# Patient Record
Sex: Male | Born: 1950 | Race: White | Hispanic: No | Marital: Married | Smoking: Never smoker
Health system: Southern US, Community
[De-identification: ages and names within clinical notes are randomized; demographics above are authoritative.]

## PROBLEM LIST (undated history)

## (undated) DIAGNOSIS — I10 Essential (primary) hypertension: Secondary | ICD-10-CM

## (undated) DIAGNOSIS — D649 Anemia, unspecified: Secondary | ICD-10-CM

## (undated) DIAGNOSIS — I4891 Unspecified atrial fibrillation: Secondary | ICD-10-CM

## (undated) HISTORY — DX: Anemia, unspecified: D64.9

## (undated) HISTORY — DX: Essential (primary) hypertension: I10

## (undated) HISTORY — PX: FINGER SURGERY: SHX640

## (undated) HISTORY — DX: Unspecified atrial fibrillation: I48.91

## (undated) HISTORY — PX: OTHER SURGICAL HISTORY: SHX169

## (undated) HISTORY — PX: RHINOPLASTY: SUR1284

## (undated) HISTORY — PX: TONSILLECTOMY: SUR1361

---

## 2004-09-03 ENCOUNTER — Other Ambulatory Visit: Payer: Self-pay

## 2004-09-04 ENCOUNTER — Ambulatory Visit: Payer: Self-pay | Admitting: Otolaryngology

## 2005-09-04 ENCOUNTER — Ambulatory Visit: Payer: Self-pay

## 2006-11-06 ENCOUNTER — Ambulatory Visit: Payer: Self-pay | Admitting: Chiropractic Medicine

## 2011-10-23 ENCOUNTER — Ambulatory Visit: Payer: Self-pay | Admitting: Physician Assistant

## 2011-12-08 ENCOUNTER — Ambulatory Visit: Payer: Self-pay | Admitting: Orthopedic Surgery

## 2011-12-08 LAB — CREATININE, SERUM
EGFR (African American): >60
EGFR (Non-African Amer.): >60

## 2011-12-09 ENCOUNTER — Ambulatory Visit: Payer: Self-pay | Admitting: Orthopedic Surgery

## 2012-09-08 DIAGNOSIS — I4891 Unspecified atrial fibrillation: Secondary | ICD-10-CM

## 2012-09-08 HISTORY — DX: Unspecified atrial fibrillation: I48.91

## 2012-09-08 HISTORY — PX: CARDIAC ELECTROPHYSIOLOGY MAPPING AND ABLATION: SHX1292

## 2012-11-08 ENCOUNTER — Ambulatory Visit: Payer: Self-pay | Admitting: Unknown Physician Specialty

## 2012-11-09 LAB — PATHOLOGY REPORT

## 2013-06-15 ENCOUNTER — Ambulatory Visit: Payer: Self-pay | Admitting: Internal Medicine

## 2013-06-17 ENCOUNTER — Encounter: Payer: Self-pay | Admitting: *Deleted

## 2013-06-21 ENCOUNTER — Ambulatory Visit (INDEPENDENT_AMBULATORY_CARE_PROVIDER_SITE_OTHER): Payer: BC Managed Care – PPO | Admitting: Podiatry

## 2013-06-21 ENCOUNTER — Encounter: Payer: Self-pay | Admitting: Podiatry

## 2013-06-21 VITALS — BP 116/82 | HR 102 | Resp 16 | Ht 74.0 in | Wt 290.0 lb

## 2013-06-21 DIAGNOSIS — M779 Enthesopathy, unspecified: Secondary | ICD-10-CM

## 2013-06-21 MED ORDER — HYDROCODONE-ACETAMINOPHEN 10-325 MG PO TABS: 1.0000 | ORAL_TABLET | Freq: Three times a day (TID) | ORAL | Status: DC | PRN

## 2013-06-21 MED ORDER — TRIAMCINOLONE ACETONIDE 10 MG/ML IJ SUSP
5.0000 mg | Freq: Once | INTRAMUSCULAR | Status: AC
  Administered 2013-06-21: 5 mg via INTRA_ARTICULAR

## 2013-06-21 NOTE — Progress Notes (Signed)
Subjective:     Patient ID: Keith Marsh, male   DOB: 1951-02-04, 62 y.o.   MRN: [id number]  HPI patient states I'm still getting burning in my feet and at times I have trouble sleeping. the right ankle is still tender at the left one feel some better   Review of Systems  All other systems reviewed and are negative.       Objective:   Physical Exam  Nursing note and vitals reviewed. Constitutional: He appears well-developed.  Cardiovascular: Intact distal pulses.   Neurological: He is alert.   patient has quite a bit of discomfort in the right sinus tarsi and is feet in general     Assessment:     Sinus tarsitis right. 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

## 2013-06-26 ENCOUNTER — Ambulatory Visit: Payer: Self-pay | Admitting: Family Medicine

## 2013-06-26 ENCOUNTER — Emergency Department: Payer: Self-pay | Admitting: Emergency Medicine

## 2013-06-26 LAB — APTT: Activated PTT: 63.8 secs — ABNORMAL HIGH (ref 23.6–35.9)

## 2013-06-26 LAB — CBC WITH DIFFERENTIAL/PLATELET
Basophil #: 0.1 10*3/uL (ref 0.0–0.1)
Eosinophil %: 3.3 %
Lymphocyte #: 2.4 10*3/uL (ref 1.0–3.6)
Lymphocyte %: 24.7 %
MCHC: 33.6 g/dL (ref 32.0–36.0)
MCV: 86 fL (ref 80–100)
Monocyte #: 0.9 x10 3/mm (ref 0.2–1.0)
Neutrophil #: 5.8 10*3/uL (ref 1.4–6.5)
Neutrophil %: 61 %
RBC: 4.42 10*6/uL (ref 4.40–5.90)
RDW: 15 % — ABNORMAL HIGH (ref 11.5–14.5)
WBC: 9.5 10*3/uL (ref 3.8–10.6)

## 2013-06-26 LAB — PROTIME-INR: INR: 1.9

## 2013-06-26 LAB — COMPREHENSIVE METABOLIC PANEL
Albumin: 3.6 g/dL (ref 3.4–5.0)
Chloride: 107 mmol/L (ref 98–107)
Creatinine: 0.83 mg/dL (ref 0.60–1.30)
EGFR (Non-African Amer.): >60
Potassium: 4.3 mmol/L (ref 3.5–5.1)
SGPT (ALT): 21 U/L (ref 12–78)
Sodium: 139 mmol/L (ref 136–145)
Total Protein: 7.1 g/dL (ref 6.4–8.2)

## 2013-06-26 LAB — TROPONIN I: Troponin-I: <0.02 ng/mL

## 2013-10-24 ENCOUNTER — Ambulatory Visit: Payer: Self-pay | Admitting: Physical Medicine and Rehabilitation

## 2014-06-02 ENCOUNTER — Ambulatory Visit: Payer: Self-pay | Admitting: Cardiology

## 2016-01-16 ENCOUNTER — Encounter: Payer: Self-pay | Admitting: *Deleted

## 2016-01-22 ENCOUNTER — Encounter: Payer: Self-pay | Admitting: General Surgery

## 2016-01-22 ENCOUNTER — Ambulatory Visit (INDEPENDENT_AMBULATORY_CARE_PROVIDER_SITE_OTHER): Payer: BLUE CROSS/BLUE SHIELD | Admitting: General Surgery

## 2016-01-22 VITALS — BP 138/82 | HR 62 | Resp 14 | Ht 73.0 in | Wt 327.0 lb

## 2016-01-22 DIAGNOSIS — L723 Sebaceous cyst: Secondary | ICD-10-CM | POA: Diagnosis not present

## 2016-01-22 NOTE — Patient Instructions (Signed)
The patient is aware to call back for any questions or concerns. Plan for office excision

## 2016-01-22 NOTE — Progress Notes (Signed)
Patient ID: Keith Marsh, male   DOB: 08-Oct-1950, 65 y.o.   MRN: [id number]  Chief Complaint  Patient presents with  . Cyst    HPI Keith Marsh is a 65 y.o. male.  Here today for evaluation of an infected cyst on his upper back. He has completed the Levaquin antibiotics today. He states that Friday it opened up and started draining. He has had this area for about 2 years and each time it flares up it gets a little large. He states it flares up about every 2-3 months. Currently not taking the gabapentin.  HPI  Past Medical History  Diagnosis Date  . Hypertension   . Anemia   . Atrial fibrillation (HCC) 2014    Past Surgical History  Procedure Laterality Date  . Rhinoplasty    . Finger surgery Left     THUMB AND INDEX  . Tonsillectomy    . Ear cartridge Left   . Cardiac electrophysiology mapping and ablation  2014    [facility]    Family History  Problem Relation Age of Onset  . Dementia Mother     Social History Social History  Substance Use Topics  . Smoking status: Never Smoker   . Smokeless tobacco: Never Used  . Alcohol Use: Yes     Comment: SOCIALLY    Allergies  Allergen Reactions  . Amoxicillin-Pot Clavulanate Nausea Only    Current Outpatient Prescriptions  Medication Sig Dispense Refill  . acetaminophen (TYLENOL) 500 MG tablet Take 500 mg by mouth every 4 (four) hours as needed for pain.    . baclofen (LIORESAL) 10 MG tablet Take 10 mg by mouth 3 (three) times daily.   0  . ELIQUIS 5 MG TABS tablet Take 5 mg by mouth 2 (two) times daily.     Marland Kitchen HYDROcodone-acetaminophen (NORCO/VICODIN) 5-325 MG tablet take 1 tablet by mouth three times a day if needed  0  . metoprolol succinate (TOPROL-XL) 25 MG 24 hr tablet Take 25 mg by mouth daily.     . metoprolol succinate (TOPROL-XL) 50 MG 24 hr tablet Take 50 mg by mouth daily. Take with or immediately following a meal.    . torsemide (DEMADEX) 20 MG tablet Take 20 mg by mouth. 2 in the am 1 in the pm    . verapamil  (CALAN) 40 MG tablet Take 40 mg by mouth as needed.     . verapamil (CALAN-SR) 240 MG CR tablet Take 240 mg by mouth daily.     Marland Kitchen gabapentin (NEURONTIN) 300 MG capsule Take 300 mg by mouth 2 (two) times daily. Reported on 01/22/2016     No current facility-administered medications for this visit.    Review of Systems Review of Systems  Constitutional: Negative.   Respiratory: Negative.   Cardiovascular: Negative.     Blood pressure 138/82, pulse 62, resp. rate 14, height  (1.854 m), weight 327 lb (148.326 kg).  Physical Exam Physical Exam  Constitutional: He is oriented to person, place, and time. He appears well-developed and well-nourished.  HENT:  Mouth/Throat: Oropharynx is clear and moist.  Eyes: Conjunctivae are normal. No scleral icterus.  Neck: Neck supple.  Cardiovascular: Normal rate, regular rhythm and normal heart sounds.   Pulmonary/Chest: Effort normal and breath sounds normal.  Lymphadenopathy:    He has no cervical adenopathy.  Neurological: He is alert and oriented to person, place, and time.  Skin: Skin is warm and dry.  2.5 x 3 area, little fluctuants,  central upper back  Psychiatric: His behavior is normal.      Assessment    Sebaceous cyst of the back.    Plan    The area has shown a significant decrease in size by patient reports in spontaneous drainage. We'll have him return in about 2-3 weeks for excision to allow resection of minimal normal tissue. While the patient reports he is not excited about having a local procedure, considering his size and cardiac history he would be a poor candidate for prone procedure under general anesthesia.    Recommend avoiding caffeine. Plan for office excision     PCP/Ref:  Keith Marsh This information has been scribed by Keith Marsh.    Keith Marsh 01/23/2016, 8:44 PM

## 2016-01-23 DIAGNOSIS — L723 Sebaceous cyst: Secondary | ICD-10-CM | POA: Insufficient documentation

## 2016-02-07 ENCOUNTER — Encounter: Payer: Self-pay | Admitting: *Deleted

## 2016-02-11 ENCOUNTER — Ambulatory Visit (INDEPENDENT_AMBULATORY_CARE_PROVIDER_SITE_OTHER): Payer: BLUE CROSS/BLUE SHIELD | Admitting: General Surgery

## 2016-02-11 VITALS — BP 140/78 | HR 72 | Resp 14 | Ht 73.0 in | Wt 323.0 lb

## 2016-02-11 DIAGNOSIS — R229 Localized swelling, mass and lump, unspecified: Secondary | ICD-10-CM

## 2016-02-11 DIAGNOSIS — R222 Localized swelling, mass and lump, trunk: Secondary | ICD-10-CM

## 2016-02-11 NOTE — Patient Instructions (Signed)
Return in one week nurse 

## 2016-02-11 NOTE — Progress Notes (Signed)
Patient ID: Keith Marsh, male   DOB: [birth year], 65 y.o.   MRN: [id number]  Chief Complaint  Patient presents with  . Procedure    excision cyst    HPI Keith Marsh is a 65 y.o. male here today for excision back cyst. The patient has had marked resolution in the discomfort at the site of the inflamed sebaceous cyst since his visit last month. He returns today for excision. HPI  Past Medical History  Diagnosis Date  . Hypertension   . Anemia   . Atrial fibrillation (HCC) 2014    Past Surgical History  Procedure Laterality Date  . Rhinoplasty    . Finger surgery Left     THUMB AND INDEX  . Tonsillectomy    . Ear cartridge Left   . Cardiac electrophysiology mapping and ablation  2014    [facility]    Family History  Problem Relation Age of Onset  . Dementia Mother     Social History Social History  Substance Use Topics  . Smoking status: Never Smoker   . Smokeless tobacco: Never Used  . Alcohol Use: Yes     Comment: SOCIALLY    Allergies  Allergen Reactions  . Amoxicillin-Pot Clavulanate Nausea Only    Current Outpatient Prescriptions  Medication Sig Dispense Refill  . acetaminophen (TYLENOL) 500 MG tablet Take 500 mg by mouth every 4 (four) hours as needed for pain.    . baclofen (LIORESAL) 10 MG tablet Take 10 mg by mouth 3 (three) times daily.   0  . ELIQUIS 5 MG TABS tablet Take 5 mg by mouth 2 (two) times daily.     Marland Kitchen. gabapentin (NEURONTIN) 300 MG capsule Take 300 mg by mouth 2 (two) times daily. Reported on 01/22/2016    . HYDROcodone-acetaminophen (NORCO/VICODIN) 5-325 MG tablet take 1 tablet by mouth three times a day if needed  0  . metoprolol succinate (TOPROL-XL) 50 MG 24 hr tablet Take 50 mg by mouth daily. Take with or immediately following a meal.    . torsemide (DEMADEX) 20 MG tablet Take 20 mg by mouth. 2 in the am 1 in the pm    . verapamil (CALAN) 40 MG tablet Take 40 mg by mouth as needed.     . verapamil (CALAN-SR) 240 MG CR tablet Take 240 mg  by mouth daily.     . metoprolol succinate (TOPROL-XL) 25 MG 24 hr tablet Take 25 mg by mouth daily.      No current facility-administered medications for this visit.    Review of Systems Review of Systems  Constitutional: Negative.   Respiratory: Negative.   Cardiovascular: Negative.     Blood pressure 140/78, pulse 72, resp. rate 14, height 6\' 1"  (1.854 m), weight 323 lb (146.512 kg).  Physical Exam Physical Exam  Constitutional: He is oriented to person, place, and time. He appears well-developed and well-nourished.  Eyes: Conjunctivae are normal. No scleral icterus.  Neck: Neck supple.  Cardiovascular: Normal rate, regular rhythm and normal heart sounds.   Pulmonary/Chest: Breath sounds normal.    Lymphadenopathy:    He has no cervical adenopathy.  Neurological: He is alert and oriented to person, place, and time.  Skin: Skin is warm and dry.      Assessment    Sebaceous cyst of the back.    Plan    The area was cleansed with alcohol and a total of 20 mL of 0.5% Xylocaine with 0.25% Marcaine with 1-200,000 of epinephrine was  utilized well tolerated. ChloraPrep was applied to the skin. The area was excised through elliptical incision including the central sinus. Hemostasis was with 3-0 Vicryl sutures. The deep tissue was approximated with interrupted 3-0 Vicryl simple sutures. The skin was closed with interrupted 4-0 Prolene simple sutures. A dry dressing with Telfa and Tegaderm was applied. Ice pack provided.  The patient will make use of Tylenol/Advil/Aleve if needed for pain.    Patient to return in one week nurse for suture removal PCP:  Dareen Piano This information has been scribed by Ples Specter CMA.    Keith Marsh 02/11/2016, 10:36 PM

## 2016-02-18 ENCOUNTER — Ambulatory Visit (INDEPENDENT_AMBULATORY_CARE_PROVIDER_SITE_OTHER): Payer: BLUE CROSS/BLUE SHIELD

## 2016-02-18 DIAGNOSIS — R222 Localized swelling, mass and lump, trunk: Secondary | ICD-10-CM

## 2016-02-18 DIAGNOSIS — R229 Localized swelling, mass and lump, unspecified: Secondary | ICD-10-CM

## 2016-02-18 NOTE — Progress Notes (Signed)
Patient came in today for a wound check.  The wound is clean, with no signs of infection noted. Follow up as needed. The sutures were removed and steri strips applied. The patient is aware of his pathology results.

## 2017-05-05 ENCOUNTER — Other Ambulatory Visit: Payer: Self-pay | Admitting: Physical Medicine and Rehabilitation

## 2017-05-05 DIAGNOSIS — M5416 Radiculopathy, lumbar region: Secondary | ICD-10-CM

## 2017-05-14 ENCOUNTER — Ambulatory Visit
Admission: RE | Admit: 2017-05-14 | Discharge: 2017-05-14 | Disposition: A | Discharge status: Home / Self Care | Payer: BLUE CROSS/BLUE SHIELD | Source: Ambulatory Visit | Attending: Physical Medicine and Rehabilitation | Admitting: Physical Medicine and Rehabilitation

## 2017-05-14 DIAGNOSIS — M5416 Radiculopathy, lumbar region: Secondary | ICD-10-CM | POA: Diagnosis present

## 2017-05-14 DIAGNOSIS — M48061 Spinal stenosis, lumbar region without neurogenic claudication: Secondary | ICD-10-CM | POA: Diagnosis not present

## 2017-05-14 DIAGNOSIS — M5125 Other intervertebral disc displacement, thoracolumbar region: Secondary | ICD-10-CM | POA: Diagnosis not present

## 2017-05-14 DIAGNOSIS — M5126 Other intervertebral disc displacement, lumbar region: Secondary | ICD-10-CM | POA: Insufficient documentation

## 2018-02-19 ENCOUNTER — Ambulatory Visit: Payer: Self-pay

## 2018-03-05 ENCOUNTER — Ambulatory Visit (INDEPENDENT_AMBULATORY_CARE_PROVIDER_SITE_OTHER): Payer: Medicare Other | Admitting: Urology

## 2018-03-05 ENCOUNTER — Encounter: Payer: Self-pay | Admitting: Urology

## 2018-03-05 VITALS — BP 133/81 | HR 78 | Ht 73.0 in | Wt 331.0 lb

## 2018-03-05 DIAGNOSIS — R350 Frequency of micturition: Secondary | ICD-10-CM | POA: Diagnosis not present

## 2018-03-05 DIAGNOSIS — R3129 Other microscopic hematuria: Secondary | ICD-10-CM

## 2018-03-05 LAB — URINALYSIS, COMPLETE
Bilirubin, UA: NEGATIVE
Glucose, UA: NEGATIVE
Ketones, UA: NEGATIVE
LEUKOCYTES UA: NEGATIVE
Nitrite, UA: NEGATIVE
Protein, UA: NEGATIVE
Specific Gravity, UA: 1.025 (ref 1.005–1.030)
Urobilinogen, Ur: 0.2 mg/dL (ref 0.2–1.0)
pH, UA: 5.5 (ref 5.0–7.5)

## 2018-03-05 LAB — MICROSCOPIC EXAMINATION
BACTERIA UA: NONE SEEN
EPITHELIAL CELLS (NON RENAL): NONE SEEN /HPF (ref 0–10)
WBC, UA: NONE SEEN /hpf (ref 0–5)

## 2018-03-05 NOTE — Progress Notes (Signed)
03/05/2018 4:15 PM   Keith Marsh 10-10-1950 [id number]  Referring provider: Lauro Regulus, MD [address] [facility] [place], [place] [address]  Chief Complaint  Patient presents with  . Hematuria    HPI:  New pt for hematuria. A UA in 2015 showed 12 rbc's. F/u UA 2019 showed 4-10 rbc's. UA today with 3-10 rbc and otherwise clear. He has no clots but occasional "pink" urine. No h/o stones. He voids with occasional weak stream. He has frequency and nocturia. He started tamsulosin and noted no change. 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 No smoking. No chemo or rt exposure.   His May 2019 PSA was 0.49.   He has a h/o Eliquis use for a fib.   PMH: Past Medical History:  Diagnosis Date  . Anemia   . Atrial fibrillation (HCC) 2014  . Hypertension     Surgical History: Reviewed   Home Medications:  Allergies as of 03/05/2018      Reactions   Amoxicillin-pot Clavulanate Nausea Only      Medication List        Accurate as of 03/05/18  4:15 PM. Always use your most recent med list.          baclofen 10 MG tablet Commonly known as:  LIORESAL Take 10 mg by mouth 3 (three) times daily.   ELIQUIS 5 MG Tabs tablet Generic drug:  apixaban Take 5 mg by mouth 2 (two) times daily.   gabapentin 300 MG capsule Commonly known as:  NEURONTIN Take 300 mg by mouth 2 (two) times daily. Reported on 01/22/2016   HYDROcodone-acetaminophen 5-325 MG tablet Commonly known as:  NORCO/VICODIN take 1 tablet by mouth three times a day if needed   metoprolol succinate 50 MG 24 hr tablet Commonly known as:  TOPROL-XL Take 50 mg by mouth daily. Take with or immediately following a meal.   metoprolol succinate 25 MG 24 hr tablet Commonly known as:  TOPROL-XL Take 25 mg by mouth daily.   torsemide 20 MG tablet Commonly known as:  DEMADEX Take 20 mg by mouth. 2 in the am 1 in the pm   TYLENOL 500 MG tablet Generic  drug:  acetaminophen Take 500 mg by mouth every 4 (four) hours as needed for pain.   verapamil 40 MG tablet Commonly known as:  CALAN Take 40 mg by mouth as needed.   verapamil 240 MG CR tablet Commonly known as:  CALAN-SR Take 240 mg by mouth daily.       Allergies:  Allergies  Allergen Reactions  . Amoxicillin-Pot Clavulanate Nausea Only    Family History: Family History  Problem Relation Age of Onset  . Dementia Mother     Social History:  reports that he has never smoked. He has never used smokeless tobacco. He reports that he drinks alcohol. He reports that he does not use drugs.  ROS: UROLOGY Frequent Urination?: Yes Hard to postpone urination?: No Burning/pain with urination?: No Get up at night to urinate?: Yes Leakage of urine?: Yes Urine stream starts and stops?: Yes Trouble starting stream?: No Do you have to strain to urinate?: No Blood in urine?: Yes Urinary tract infection?: No Sexually transmitted disease?: No Injury to kidneys or bladder?: No Painful intercourse?: No Weak stream?: Yes Erection problems?: No Penile pain?: No  Gastrointestinal Nausea?: No Vomiting?: No Indigestion/heartburn?: Yes Diarrhea?: No Constipation?: No  Constitutional Fever: No Night  sweats?: No Weight loss?: No Fatigue?: No  Skin Skin rash/lesions?: No Itching?: No  Eyes Blurred vision?: No Double vision?: No  Ears/Nose/Throat Sore throat?: No Sinus problems?: No  Hematologic/Lymphatic Swollen glands?: No Easy bruising?: No  Cardiovascular Leg swelling?: No Chest pain?: No  Respiratory Cough?: No Shortness of breath?: No  Endocrine Excessive thirst?: No  Musculoskeletal Back pain?: Yes Joint pain?: Yes  Neurological Headaches?: No Dizziness?: No  Psychologic Depression?: No Anxiety?: No  Physical Exam: BP 133/81   Pulse 78   Ht 6\' 1"  (1.854 m)   Wt (!) 150.1 kg (331 lb)   BMI 43.67 kg/m   Constitutional:  Alert and  oriented, No acute distress. HEENT: [place] AT, moist mucus membranes.  Trachea midline, no masses. Cardiovascular: No clubbing, cyanosis, or edema. Respiratory: Normal respiratory effort, no increased work of breathing. GI: Abdomen is soft, nontender, nondistended, no abdominal masses GU: No CVA tenderness DRE: prostate 20 g, no hard area or nodule  Lymph: No cervical or inguinal lymphadenopathy. Skin: No rashes, bruises or suspicious lesions. Neurologic: Grossly intact, no focal deficits, moving all 4 extremities. Psychiatric: Normal mood and affect.  Laboratory Data: Lab Results  Component Value Date   WBC 9.5 06/26/2013   HGB 12.8 (L) 06/26/2013   HCT 38.2 (L) 06/26/2013   MCV 86 06/26/2013   PLT 410 06/26/2013    Lab Results  Component Value Date   CREATININE 0.83 06/26/2013    No results found for: PSA  No results found for: TESTOSTERONE  No results found for: HGBA1C  Urinalysis No results found for: COLORURINE, APPEARANCEUR, LABSPEC, PHURINE, GLUCOSEU, HGBUR, BILIRUBINUR, KETONESUR, PROTEINUR, UROBILINOGEN, NITRITE, LEUKOCYTESUR  No results found for: LABMICR, WBCUA, RBCUA, LABEPIT, MUCUS, BACTERIA   No results found for this or any previous visit. No results found for this or any previous visit. No results found for this or any previous visit. No results found for this or any previous visit. No results found for this or any previous visit. No results found for this or any previous visit. No results found for this or any previous visit. No results found for this or any previous visit.  Assessment & Plan:    1. Hematuria - needs CT and cystoscopy. Discussed the nature r/b/a to these procedures and he elects to proceed.   2. Frequency - tamsulosin no change. Check cysto as above. Consider OAB med.   - Urinalysis, Complete   No follow-ups on file.  Jerilee FieldMatthew Lorene Klimas, MD  [facility] [address], [address] [place], [place]  [id number] [phone]

## 2018-03-22 ENCOUNTER — Ambulatory Visit
Admission: RE | Admit: 2018-03-22 | Discharge: 2018-03-22 | Disposition: A | Discharge status: Home / Self Care | Payer: Medicare Other | Source: Ambulatory Visit | Attending: Urology | Admitting: Urology

## 2018-03-22 DIAGNOSIS — I2510 Atherosclerotic heart disease of native coronary artery without angina pectoris: Secondary | ICD-10-CM | POA: Insufficient documentation

## 2018-03-22 DIAGNOSIS — R3129 Other microscopic hematuria: Secondary | ICD-10-CM | POA: Insufficient documentation

## 2018-03-22 LAB — POCT I-STAT CREATININE: Creatinine, Ser: 1 mg/dL (ref 0.61–1.24)

## 2018-03-22 MED ORDER — IOPAMIDOL (ISOVUE-300) INJECTION 61%
150.0000 mL | Freq: Once | INTRAVENOUS | Status: AC | PRN
  Administered 2018-03-22: 150 mL via INTRAVENOUS

## 2018-03-23 ENCOUNTER — Telehealth: Payer: Self-pay

## 2018-03-23 NOTE — Telephone Encounter (Signed)
Patient notified

## 2018-03-23 NOTE — Telephone Encounter (Signed)
-----   Message from Jerilee FieldMatthew Eskridge, MD sent at 03/22/2018  4:43 PM EDT ----- Notify patient CT scan was benign. No worrisome findings. F/u for cystoscopy as planned. Radiologist did note he had "advanced atherosclerosis" or hardening of the arteries. He should f/u with his PCP or cardiologist to review.   Thanks.   ----- Message ----- From: Lissa HoardWatts, Mayme Profeta Michelle, CMA Sent: 03/22/2018   4:14 PM To: Jerilee FieldMatthew Eskridge, MD    ----- Message ----- From: Interface, Rad Results In Sent: 03/22/2018   3:55 PM To: [facility]

## 2018-04-22 ENCOUNTER — Ambulatory Visit (INDEPENDENT_AMBULATORY_CARE_PROVIDER_SITE_OTHER): Payer: Medicare Other | Admitting: Urology

## 2018-04-22 ENCOUNTER — Encounter: Payer: Self-pay | Admitting: Urology

## 2018-04-22 VITALS — BP 138/84 | HR 74 | Ht 73.0 in | Wt 333.0 lb

## 2018-04-22 DIAGNOSIS — R3129 Other microscopic hematuria: Secondary | ICD-10-CM

## 2018-04-22 LAB — URINALYSIS, COMPLETE
Bilirubin, UA: NEGATIVE
Glucose, UA: NEGATIVE
Ketones, UA: NEGATIVE
LEUKOCYTES UA: NEGATIVE
Nitrite, UA: NEGATIVE
PH UA: 5 (ref 5.0–7.5)
Protein, UA: NEGATIVE
Urobilinogen, Ur: 0.2 mg/dL (ref 0.2–1.0)

## 2018-04-22 LAB — MICROSCOPIC EXAMINATION
BACTERIA UA: NONE SEEN
EPITHELIAL CELLS (NON RENAL): NONE SEEN /HPF (ref 0–10)
WBC, UA: NONE SEEN /hpf (ref 0–5)

## 2018-04-22 MED ORDER — LIDOCAINE HCL URETHRAL/MUCOSAL 2 % EX GEL
1.0000 | Freq: Once | CUTANEOUS | Status: AC
Start: 2018-04-22 — End: 2018-04-22
  Administered 2018-04-22: 1 via URETHRAL

## 2018-04-22 MED ORDER — CIPROFLOXACIN HCL 500 MG PO TABS
500.0000 mg | ORAL_TABLET | Freq: Once | ORAL | Status: AC
  Administered 2018-04-22: 500 mg via ORAL

## 2018-04-22 NOTE — Progress Notes (Addendum)
Cystoscopy Procedure Note:  Indication: Gross hematuria  After informed consent and discussion of the procedure and its risks, Keith Marsh was positioned and prepped in the standard fashion. Cystoscopy was performed with the flexible cystoscope. The urethra, bladder neck and entire bladder was visualized in a standard fashion. The prostate was short. The ureteral orifices were visualized in their normal location and orientation. Retroflexion did not demonstrate any concerning lesions at the bladder neck. Cytology sent. Mucosa normal throughout.  Findings: 1. Normal cystoscopy, small prostate  Imaging: I personally reviewed his CT urogram from 03/22/2018.  There are no renal masses, filling defects, urolithiasis, or other concerning urologic findings.  This was discussed with the patient.  Assessment and Plan: Follow up as needed Continue flomax for BPH symptoms  Sondra ComeBrian C Sninsky 04/22/2018

## 2018-04-27 ENCOUNTER — Other Ambulatory Visit: Payer: Self-pay | Admitting: Urology

## 2018-04-28 ENCOUNTER — Telehealth: Payer: Self-pay

## 2018-04-28 NOTE — Telephone Encounter (Signed)
-----   Message from Sondra ComeBrian C Sninsky, MD sent at 04/27/2018  5:34 PM EDT ----- Regarding: negative cytology Please let him know his cytology was negative. No worrisome cells from the cystoscopy were seen. He can follow up PRN  Sondra ComeBrian C Sninsky, MD 04/27/2018

## 2018-04-28 NOTE — Telephone Encounter (Signed)
Called pt no answer. LM for pt informing him of the results below. Pt to call back for questions or concerns.

## 2019-04-28 ENCOUNTER — Encounter: Admission: RE | Admit: 2019-04-28 | Payer: Medicare Other | Source: Ambulatory Visit

## 2019-05-05 ENCOUNTER — Other Ambulatory Visit
Admission: RE | Admit: 2019-05-05 | Discharge: 2019-05-05 | Disposition: A | Discharge status: Home / Self Care | Payer: Medicare Other | Source: Ambulatory Visit | Attending: Internal Medicine | Admitting: Internal Medicine

## 2019-05-05 ENCOUNTER — Other Ambulatory Visit: Payer: Self-pay

## 2019-05-05 DIAGNOSIS — K2970 Gastritis, unspecified, without bleeding: Secondary | ICD-10-CM | POA: Insufficient documentation

## 2019-05-05 DIAGNOSIS — K2980 Duodenitis without bleeding: Secondary | ICD-10-CM | POA: Insufficient documentation

## 2019-05-05 DIAGNOSIS — R1310 Dysphagia, unspecified: Secondary | ICD-10-CM | POA: Diagnosis not present

## 2019-05-05 DIAGNOSIS — K222 Esophageal obstruction: Secondary | ICD-10-CM | POA: Insufficient documentation

## 2019-05-05 DIAGNOSIS — Z20828 Contact with and (suspected) exposure to other viral communicable diseases: Secondary | ICD-10-CM | POA: Insufficient documentation

## 2019-05-05 DIAGNOSIS — K5790 Diverticulosis of intestine, part unspecified, without perforation or abscess without bleeding: Secondary | ICD-10-CM | POA: Insufficient documentation

## 2019-05-05 DIAGNOSIS — Z01812 Encounter for preprocedural laboratory examination: Secondary | ICD-10-CM | POA: Insufficient documentation

## 2019-05-05 DIAGNOSIS — K269 Duodenal ulcer, unspecified as acute or chronic, without hemorrhage or perforation: Secondary | ICD-10-CM | POA: Diagnosis not present

## 2019-05-05 DIAGNOSIS — K648 Other hemorrhoids: Secondary | ICD-10-CM | POA: Insufficient documentation

## 2019-05-05 DIAGNOSIS — Z86010 Personal history of colonic polyps: Secondary | ICD-10-CM | POA: Insufficient documentation

## 2019-05-05 LAB — SARS CORONAVIRUS 2 (TAT 6-24 HRS): SARS Coronavirus 2: NEGATIVE

## 2019-05-08 NOTE — H&P (Signed)
Outpatient short stay form Pre-procedure 05/08/2019 5:50 PM Keith K. Alice Reichert, M.D.  Primary Physician: Frazier Richards, M.D.  Reason for visit:  Personal hx of adenomatous colon polyps, Esophageal dysphagia.  History of present illness:  68 year old male presents for colon polyp surveillance.  He has mild chronic constipation.  No alarm symptoms of bleeding or abnormal weight loss.  Has esophageal dysphagia to bread but not quite other solids.   No current facility-administered medications for this encounter.   Current Outpatient Medications:  .  acetaminophen (TYLENOL) 500 MG tablet, Take 500 mg by mouth every 4 (four) hours as needed for pain., Disp: , Rfl:  .  baclofen (LIORESAL) 10 MG tablet, Take 10 mg by mouth 3 (three) times daily. , Disp: , Rfl: 0 .  ELIQUIS 5 MG TABS tablet, Take 5 mg by mouth 2 (two) times daily. , Disp: , Rfl:  .  gabapentin (NEURONTIN) 300 MG capsule, Take 300 mg by mouth 2 (two) times daily. Reported on 01/22/2016, Disp: , Rfl:  .  HYDROcodone-acetaminophen (NORCO/VICODIN) 5-325 MG tablet, take 1 tablet by mouth three times a day if needed, Disp: , Rfl: 0 .  meclizine (ANTIVERT) 25 MG tablet, take 1 tablet by mouth three times a day if needed for dizziness, Disp: , Rfl: 0 .  metoprolol succinate (TOPROL-XL) 25 MG 24 hr tablet, Take 25 mg by mouth daily. , Disp: , Rfl:  .  metoprolol succinate (TOPROL-XL) 50 MG 24 hr tablet, Take 50 mg by mouth daily. Take with or immediately following a meal., Disp: , Rfl:  .  nortriptyline (PAMELOR) 25 MG capsule, , Disp: , Rfl: 1 .  tamsulosin (FLOMAX) 0.4 MG CAPS capsule, , Disp: , Rfl: 1 .  torsemide (DEMADEX) 20 MG tablet, Take 20 mg by mouth. 2 in the am 1 in the pm, Disp: , Rfl:  .  verapamil (CALAN) 40 MG tablet, Take 40 mg by mouth as needed. , Disp: , Rfl:  .  verapamil (CALAN-SR) 240 MG CR tablet, Take 240 mg by mouth daily. , Disp: , Rfl:   No medications prior to admission.     Allergies  Allergen  Reactions  . Amoxicillin-Pot Clavulanate Nausea Only     Past Medical History:  Diagnosis Date  . Anemia   . Atrial fibrillation ([place]) 2014  . Hypertension     Review of systems:  Otherwise negative.    Physical Exam  Gen: Alert, oriented. Appears stated age.  HEENT: [place]/AT. PERRLA. Lungs: CTA, no wheezes. CV: RR nl S1, S2. Abd: soft, benign, no masses. BS+ Ext: No edema. Pulses 2+  Impression:  1. History of adenomatous polyp of colon  2. Chronic constipation  3. Esophageal dysphagia  4. Anemia, unspecified type  5. Gastric ulcer without hemorrhage or perforation, unspecified chronicity    Planned procedures: Proceed with EGD and colonoscopy. The patient understands the nature of the planned procedure, indications, risks, alternatives and potential complications including but not limited to bleeding, infection, perforation, damage to internal organs and possible oversedation/side effects from anesthesia. The patient agrees and gives consent to proceed.  Please refer to procedure notes for findings, recommendations and patient disposition/instructions.     Keith K. Alice Reichert, M.D. Gastroenterology 05/08/2019  5:50 PM

## 2019-05-09 ENCOUNTER — Encounter: Payer: Self-pay | Admitting: *Deleted

## 2019-05-09 ENCOUNTER — Ambulatory Visit
Admission: RE | Admit: 2019-05-09 | Discharge: 2019-05-09 | Disposition: A | Discharge status: Home / Self Care | Payer: Medicare Other | Attending: Internal Medicine | Admitting: Internal Medicine

## 2019-05-09 ENCOUNTER — Ambulatory Visit: Payer: Medicare Other | Admitting: Certified Registered"

## 2019-05-09 ENCOUNTER — Encounter
Admission: RE | Disposition: A | Discharge status: Home / Self Care | Payer: Self-pay | Source: Home / Self Care | Attending: Internal Medicine

## 2019-05-09 DIAGNOSIS — I4891 Unspecified atrial fibrillation: Secondary | ICD-10-CM | POA: Insufficient documentation

## 2019-05-09 DIAGNOSIS — I10 Essential (primary) hypertension: Secondary | ICD-10-CM | POA: Diagnosis not present

## 2019-05-09 DIAGNOSIS — Z09 Encounter for follow-up examination after completed treatment for conditions other than malignant neoplasm: Secondary | ICD-10-CM | POA: Insufficient documentation

## 2019-05-09 DIAGNOSIS — K5730 Diverticulosis of large intestine without perforation or abscess without bleeding: Secondary | ICD-10-CM | POA: Diagnosis not present

## 2019-05-09 DIAGNOSIS — K3189 Other diseases of stomach and duodenum: Secondary | ICD-10-CM | POA: Insufficient documentation

## 2019-05-09 DIAGNOSIS — K269 Duodenal ulcer, unspecified as acute or chronic, without hemorrhage or perforation: Secondary | ICD-10-CM | POA: Insufficient documentation

## 2019-05-09 DIAGNOSIS — R1314 Dysphagia, pharyngoesophageal phase: Secondary | ICD-10-CM | POA: Diagnosis not present

## 2019-05-09 DIAGNOSIS — D649 Anemia, unspecified: Secondary | ICD-10-CM | POA: Insufficient documentation

## 2019-05-09 DIAGNOSIS — K2950 Unspecified chronic gastritis without bleeding: Secondary | ICD-10-CM | POA: Insufficient documentation

## 2019-05-09 DIAGNOSIS — K5909 Other constipation: Secondary | ICD-10-CM | POA: Insufficient documentation

## 2019-05-09 DIAGNOSIS — Z8711 Personal history of peptic ulcer disease: Secondary | ICD-10-CM | POA: Diagnosis not present

## 2019-05-09 DIAGNOSIS — K640 First degree hemorrhoids: Secondary | ICD-10-CM | POA: Diagnosis not present

## 2019-05-09 DIAGNOSIS — Z79899 Other long term (current) drug therapy: Secondary | ICD-10-CM | POA: Insufficient documentation

## 2019-05-09 DIAGNOSIS — Z86010 Personal history of colonic polyps: Secondary | ICD-10-CM | POA: Diagnosis not present

## 2019-05-09 DIAGNOSIS — Z7901 Long term (current) use of anticoagulants: Secondary | ICD-10-CM | POA: Diagnosis not present

## 2019-05-09 HISTORY — PX: COLONOSCOPY WITH PROPOFOL: SHX5780

## 2019-05-09 HISTORY — PX: ESOPHAGOGASTRODUODENOSCOPY (EGD) WITH PROPOFOL: SHX5813

## 2019-05-09 SURGERY — ESOPHAGOGASTRODUODENOSCOPY (EGD) WITH PROPOFOL
Anesthesia: General

## 2019-05-09 MED ORDER — PROPOFOL 500 MG/50ML IV EMUL
INTRAVENOUS | Status: AC
  Filled 2019-05-09: qty 50

## 2019-05-09 MED ORDER — LIDOCAINE HCL (CARDIAC) PF 100 MG/5ML IV SOSY
PREFILLED_SYRINGE | INTRAVENOUS | Status: DC | PRN
  Administered 2019-05-09: 40 mg via INTRAVENOUS

## 2019-05-09 MED ORDER — PROPOFOL 500 MG/50ML IV EMUL
INTRAVENOUS | Status: DC | PRN
  Administered 2019-05-09: 100 ug/kg/min via INTRAVENOUS

## 2019-05-09 MED ORDER — LIDOCAINE HCL (PF) 2 % IJ SOLN
INTRAMUSCULAR | Status: AC
  Filled 2019-05-09: qty 10

## 2019-05-09 MED ORDER — SODIUM CHLORIDE 0.9 % IV SOLN
INTRAVENOUS | Status: DC
  Administered 2019-05-09: 12:00:00 via INTRAVENOUS

## 2019-05-09 MED ORDER — PROPOFOL 10 MG/ML IV BOLUS
INTRAVENOUS | Status: AC
  Filled 2019-05-09: qty 20

## 2019-05-09 MED ORDER — ONDANSETRON HCL 4 MG/2ML IJ SOLN
INTRAMUSCULAR | Status: AC
  Filled 2019-05-09: qty 2

## 2019-05-09 MED ORDER — PROPOFOL 10 MG/ML IV BOLUS
INTRAVENOUS | Status: DC | PRN
  Administered 2019-05-09: 50 mg via INTRAVENOUS
  Administered 2019-05-09: 20 mg via INTRAVENOUS
  Administered 2019-05-09: 50 mg via INTRAVENOUS

## 2019-05-09 MED ORDER — LABETALOL HCL 5 MG/ML IV SOLN
INTRAVENOUS | Status: DC | PRN
  Administered 2019-05-09 (×2): 5 mg via INTRAVENOUS

## 2019-05-09 NOTE — Op Note (Signed)
[facility] Patient Name: Keith Marsh Procedure Date: 05/09/2019 12:36 PM MRN: [id number] Account #: [id number] Date of Birth: November 17, 1950 Admit Type: Outpatient Age: 68 Room: [facility] ENDO ROOM 1 Gender: Male Note Status: Finalized Procedure:            Colonoscopy Indications:          High risk colon cancer surveillance: Personal history                        of colonic polyps Providers:            Benay Pike. Alice Reichert MD, MD Referring MD:         Ocie Cornfield. Ouida Sills MD, MD (Referring MD) Medicines:            Propofol per Anesthesia Complications:        No immediate complications. Procedure:            Pre-Anesthesia Assessment:                       - The risks and benefits of the procedure and the                        sedation options and risks were discussed with the                        patient. All questions were answered and informed                        consent was obtained.                       - Patient identification and proposed procedure were                        verified prior to the procedure by the nurse. The                        procedure was verified in the procedure room.                       - ASA Grade Assessment: III - A patient with severe                        systemic disease.                       - After reviewing the risks and benefits, the patient                        was deemed in satisfactory condition to undergo the                        procedure.                       After obtaining informed consent, the colonoscope was                        passed under direct vision. Throughout the procedure,  the patient's blood pressure, pulse, and oxygen                        saturations were monitored continuously. The                        Colonoscope was introduced through the anus and                        advanced to the the cecum, identified by appendiceal     orifice and ileocecal valve. The colonoscopy was                        performed without difficulty. The patient tolerated the                        procedure well. The quality of the bowel preparation                        was excellent. The ileocecal valve, appendiceal                        orifice, and rectum were photographed. Findings:      The perianal and digital rectal examinations were normal. Pertinent       negatives include normal sphincter tone and no palpable rectal lesions.      A few small-mouthed diverticula were found in the sigmoid colon.      Non-bleeding internal hemorrhoids were found during retroflexion. The       hemorrhoids were Grade I (internal hemorrhoids that do not prolapse).      The exam was otherwise without abnormality. Impression:           - Diverticulosis in the sigmoid colon.                       - Non-bleeding internal hemorrhoids.                       - The examination was otherwise normal.                       - No specimens collected. Recommendation:       - Await pathology results from EGD, also performed                        today.                       - Monitor results to esophageal dilation                       - Patient has a contact number available for                        emergencies. The signs and symptoms of potential                        delayed complications were discussed with the patient.                        Return to normal activities tomorrow. Written discharge  instructions were provided to the patient.                       - Resume previous diet.                       - Continue present medications.                       - Repeat colonoscopy in 5 years for surveillance.                       - Return to physician assistant as previously scheduled.                       - The findings and recommendations were discussed with                        the patient. Procedure Code(s):    ---  Professional ---                       [id number], Colorectal cancer screening; colonoscopy on                        individual at high risk Diagnosis Code(s):    --- Professional ---                       K57.30, Diverticulosis of large intestine without                        perforation or abscess without bleeding                       K64.0, First degree hemorrhoids                       Z86.010, Personal history of colonic polyps CPT copyright 2019 American Medical Association. All rights reserved. The codes documented in this report are preliminary and upon coder review may  be revised to meet current compliance requirements. Stanton Kidneyeodoro K Toledo MD, MD 05/09/2019 1:11:54 PM This report has been signed electronically. Number of Addenda: 0 Note Initiated On: 05/09/2019 12:36 PM Scope Withdrawal Time: 0 hours 5 minutes 57 seconds  Total Procedure Duration: 0 hours 12 minutes 5 seconds  Estimated Blood Loss: Estimated blood loss: none.      [facility]

## 2019-05-09 NOTE — Anesthesia Preprocedure Evaluation (Signed)
Anesthesia Evaluation  Patient identified by MRN, date of birth, ID band Patient awake    Reviewed: Allergy & Precautions, H&P , NPO status , Patient's Chart, lab work & pertinent test results, reviewed documented beta blocker date and time   Airway Mallampati: II   Neck ROM: full    Dental  (+) Poor Dentition   Pulmonary neg pulmonary ROS,    Pulmonary exam normal        Cardiovascular Exercise Tolerance: Good hypertension, On Medications negative cardio ROS Normal cardiovascular exam Rhythm:regular Rate:Normal     Neuro/Psych negative neurological ROS  negative psych ROS   GI/Hepatic negative GI ROS, Neg liver ROS,   Endo/Other  negative endocrine ROS  Renal/GU negative Renal ROS  negative genitourinary   Musculoskeletal   Abdominal   Peds  Hematology  (+) Blood dyscrasia, anemia ,   Anesthesia Other Findings Past Medical History: No date: Anemia 2014: Atrial fibrillation ([place]) No date: Hypertension Past Surgical History: 2014: CARDIAC ELECTROPHYSIOLOGY MAPPING AND ABLATION     Comment:  [facility] No date: EAR CARTRIDGE; Left No date: FINGER SURGERY; Left     Comment:  THUMB AND INDEX No date: RHINOPLASTY No date: TONSILLECTOMY   Reproductive/Obstetrics negative OB ROS                             Anesthesia Physical Anesthesia Plan  ASA: III  Anesthesia Plan: General   Post-op Pain Management:    Induction:   PONV Risk Score and Plan:   Airway Management Planned:   Additional Equipment:   Intra-op Plan:   Post-operative Plan:   Informed Consent: I have reviewed the patients History and Physical, chart, labs and discussed the procedure including the risks, benefits and alternatives for the proposed anesthesia with the patient or authorized representative who has indicated his/her understanding and acceptance.     Dental Advisory Given  Plan Discussed with:  CRNA  Anesthesia Plan Comments:         Anesthesia Quick Evaluation

## 2019-05-09 NOTE — Interval H&P Note (Signed)
History and Physical Interval Note:  05/09/2019 11:31 AM  Keith Marsh  has presented today for surgery, with the diagnosis of DYSPHAGIA,PERSONAL HX.OF COLON POLYPS.  The various methods of treatment have been discussed with the patient and family. After consideration of risks, benefits and other options for treatment, the patient has consented to  Procedure(s): ESOPHAGOGASTRODUODENOSCOPY (EGD) WITH PROPOFOL (N/A) COLONOSCOPY WITH PROPOFOL (N/A) as a surgical intervention.  The patient's history has been reviewed, patient examined, no change in status, stable for surgery.  I have reviewed the patient's chart and labs.  Questions were answered to the patient's satisfaction.     [place], [place]

## 2019-05-09 NOTE — Transfer of Care (Signed)
Immediate Anesthesia Transfer of Care Note  Patient: Keith Marsh  Procedure(s) Performed: ESOPHAGOGASTRODUODENOSCOPY (EGD) WITH PROPOFOL (N/A ) COLONOSCOPY WITH PROPOFOL (N/A )  Patient Location: PACU  Anesthesia Type:General  Level of Consciousness: awake, alert  and oriented  Airway & Oxygen Therapy: Patient Spontanous Breathing  Post-op Assessment: Report given to RN and Post -op Vital signs reviewed and stable  Post vital signs: Reviewed and stable  Last Vitals:  Vitals Value Taken Time  BP 139/92 05/09/19 1311  Temp 36.4 C 05/09/19 1311  Pulse 74 05/09/19 1312  Resp 21 05/09/19 1312  SpO2 96 % 05/09/19 1312  Vitals shown include unvalidated device data.  Last Pain:  Vitals:   05/09/19 1311  TempSrc: Tympanic  PainSc: 0-No pain         Complications: No apparent anesthesia complications

## 2019-05-09 NOTE — Op Note (Signed)
Keith Marsh Patient Name: Keith MechanicLarry Marsh Procedure Date: 05/09/2019 12:38 PM MRN: [id number] Account #: [id number] Date of Birth: [birth year] Admit Type: Outpatient Age: 68 Room: [facility] ENDO ROOM 1 Gender: Male Note Status: Finalized Procedure:            Upper GI endoscopy Indications:          Dysphagia, Follow-up of gastric ulcer Providers:            Boykin Nearingeodoro K. Norma Fredricksonoledo MD, MD Referring MD:         Marya AmslerMarshall W. Dareen PianoAnderson MD, MD (Referring MD) Medicines:            Propofol per Anesthesia Complications:        No immediate complications. Procedure:            Pre-Anesthesia Assessment:                       - The risks and benefits of the procedure and the                        sedation options and risks were discussed with the                        patient. All questions were answered and informed                        consent was obtained.                       - Patient identification and proposed procedure were                        verified prior to the procedure by the nurse. The                        procedure was verified in the procedure room.                       - ASA Grade Assessment: III - A patient with severe                        systemic disease.                       - After reviewing the risks and benefits, the patient                        was deemed in satisfactory condition to undergo the                        procedure.                       After obtaining informed consent, the endoscope was                        passed under direct vision. Throughout the procedure,                        the patient's blood pressure, pulse, and oxygen  saturations were monitored continuously. The Endoscope                        was introduced through the mouth, and advanced to the                        third part of duodenum. The upper GI endoscopy was                        accomplished without difficulty. The  patient tolerated                        the procedure well. Findings:      Mucosal changes including feline appearance were found in the entire       esophagus. Esophageal findings were graded using the Eosinophilic       Esophagitis Endoscopic Reference Score (EoE-EREFS) as: Edema Grade 0       Normal (distinct vascular markings), Rings Grade 1 Mild (subtle       circumferential ridges seen on esophageal distension), Exudates Grade 0       None (no white lesions seen), Furrows Grade 0 None (no vertical lines       seen) and Stricture none (no stricture found). Biopsies were obtained       from the proximal and distal esophagus with cold forceps for histology       of suspected eosinophilic esophagitis. The scope was withdrawn. Dilation       was performed with a Maloney dilator with no resistance at 54 Fr.      Localized mild inflammation characterized by congestion (edema) and       erythema was found in the gastric antrum. Biopsies were taken with a       cold forceps for Helicobacter pylori testing.      The cardia and gastric fundus were normal on retroflexion.      One non-bleeding superficial duodenal ulcer with no stigmata of bleeding       was found in the first portion of the duodenum. The lesion was 4 mm in       largest dimension.      Patchy mildly erythematous mucosa without active bleeding and with no       stigmata of bleeding was found in the duodenal bulb.      The second portion of the duodenum and third portion of the duodenum       were normal.      The exam was otherwise without abnormality. Impression:           - Esophageal mucosal changes suspicious for                        eosinophilic esophagitis. Biopsied. Dilated.                       - Gastritis. Biopsied.                       - Non-bleeding duodenal ulcer with no stigmata of                        bleeding.                       - Erythematous duodenopathy.                       -  Normal second  portion of the duodenum and third                        portion of the duodenum.                       - The examination was otherwise normal. Recommendation:       - Await pathology results.                       - Monitor results to esophageal dilation                       - Proceed with colonoscopy Procedure Code(s):    --- Professional ---                       [phone], Esophagogastroduodenoscopy, flexible, transoral;                        with biopsy, single or multiple                       43450, Dilation of esophagus, by unguided sound or                        bougie, single or multiple passes Diagnosis Code(s):    --- Professional ---                       K25.9, Gastric ulcer, unspecified as acute or chronic,                        without hemorrhage or perforation                       R13.10, Dysphagia, unspecified                       K31.89, Other diseases of stomach and duodenum                       K26.9, Duodenal ulcer, unspecified as acute or chronic,                        without hemorrhage or perforation                       K29.70, Gastritis, unspecified, without bleeding                       K22.8, Other specified diseases of esophagus CPT copyright 2019 American Medical Association. All rights reserved. The codes documented in this report are preliminary and upon coder review may  be revised to meet current compliance requirements. Efrain Sella MD, MD 05/09/2019 12:53:47 PM This report has been signed electronically. Number of Addenda: 0 Note Initiated On: 05/09/2019 12:38 PM Estimated Blood Loss: Estimated blood loss: none.      [facility]

## 2019-05-09 NOTE — Anesthesia Post-op Follow-up Note (Signed)
Anesthesia QCDR form completed.        

## 2019-05-11 NOTE — Anesthesia Postprocedure Evaluation (Signed)
Anesthesia Post Note  Patient: ERIS BRECK  Procedure(s) Performed: ESOPHAGOGASTRODUODENOSCOPY (EGD) WITH PROPOFOL (N/A ) COLONOSCOPY WITH PROPOFOL (N/A )  Patient location during evaluation: PACU Anesthesia Type: General Level of consciousness: awake and alert Pain management: pain level controlled Vital Signs Assessment: post-procedure vital signs reviewed and stable Respiratory status: spontaneous breathing, nonlabored ventilation, respiratory function stable and patient connected to nasal cannula oxygen Cardiovascular status: blood pressure returned to baseline and stable Postop Assessment: no apparent nausea or vomiting Anesthetic complications: no     Last Vitals:  Vitals:   05/09/19 1311 05/09/19 1321  BP:  123/83  Pulse:    Resp: 20   Temp: 36.4 C   SpO2:      Last Pain:  Vitals:   05/09/19 1341  TempSrc:   PainSc: 0-No pain                 Molli Barrows

## 2019-05-12 LAB — SURGICAL PATHOLOGY

## 2020-03-15 IMAGING — CT CT ABD-PEL WO/W CM
3 of 12 series · 12 of 46 positions shown, 18 images · IV contrast (iopamidol)
Comparison: None.

CLINICAL DATA: Microscopic hematuria.

EXAM:
CT ABDOMEN AND PELVIS WITHOUT AND WITH CONTRAST
TECHNIQUE: Multidetector CT imaging of the abdomen and pelvis was performed
following the standard protocol before and following the bolus
administration of intravenous contrast.
CONTRAST:  150mL [id number] IOPAMIDOL ([id number]) INJECTION 61%

[Series 3: without pre · axial · non-contrast · 0.98mm/px · z∈[-1544,-1229]mm · 4 of 106 slices shown]
[im 22/106  soft-tissue]
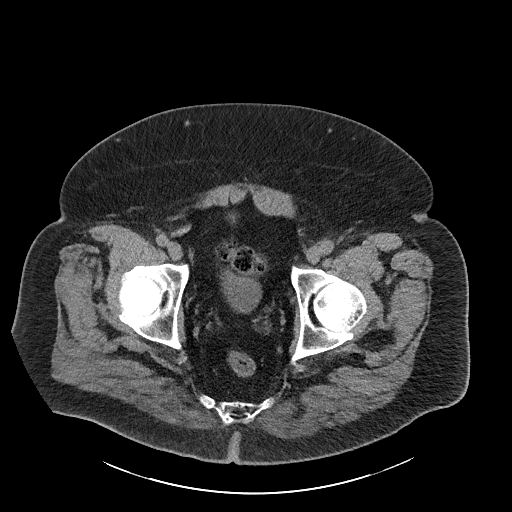
[im 43/106  soft-tissue]
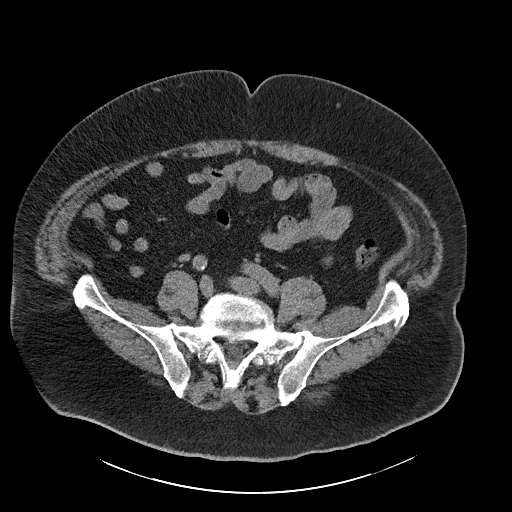
[im 64/106  soft-tissue]
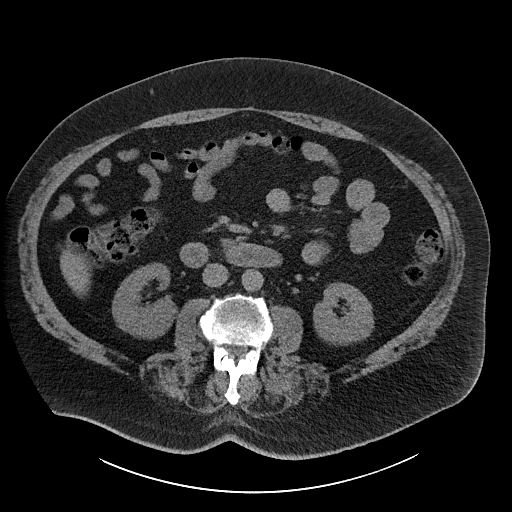
[im 85/106  soft-tissue]
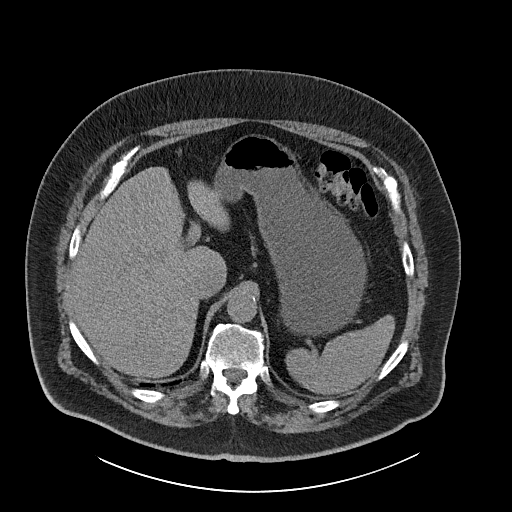

[Series 6: cor without without pre · coronal · non-contrast · 0.96mm/px · 2 of 200 slices shown, 3 images]
[im 67/200  soft-tissue]
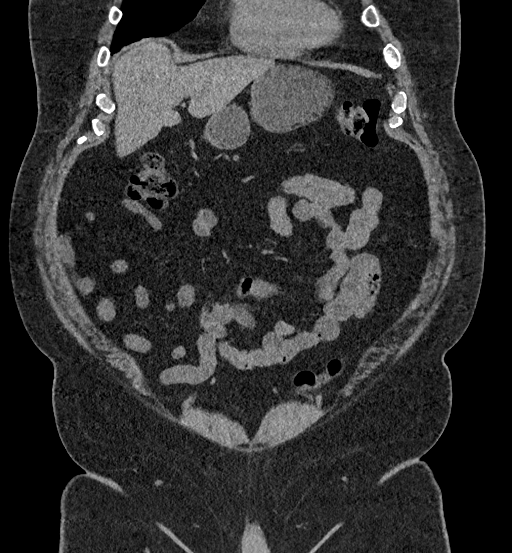
[im 67/200  bone]
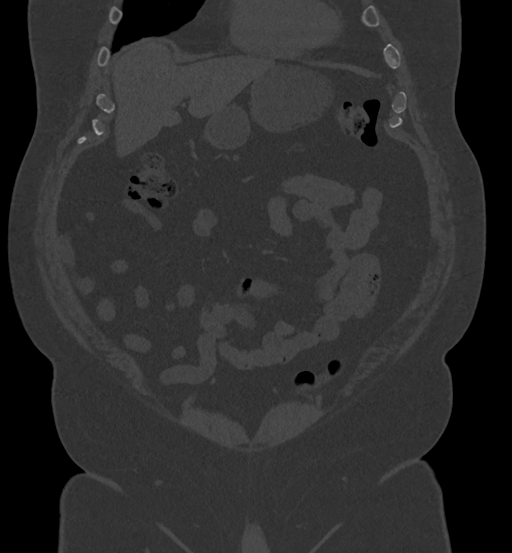
[im 133/200  soft-tissue]
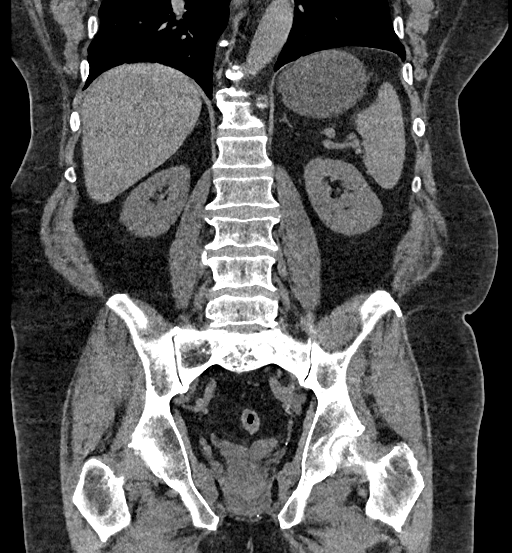

[Series 17: axial delay delay prone · axial · delayed · 0.91mm/px · z∈[-1580,-1170]mm · 6 of 116 slices shown, 11 images]
[im 17/116  soft-tissue]
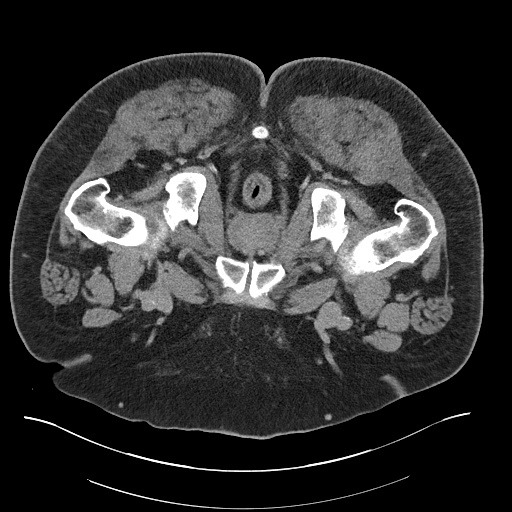
[im 17/116  bone]
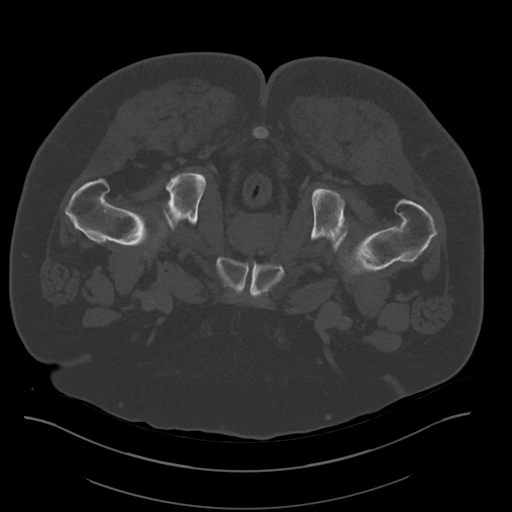
[im 33/116  soft-tissue]
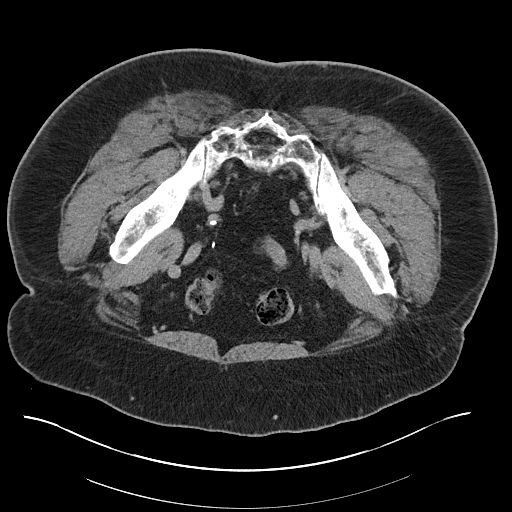
[im 50/116  soft-tissue]
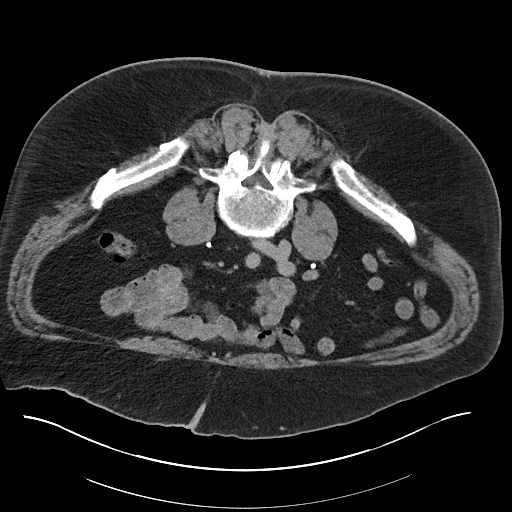
[im 50/116  lung]
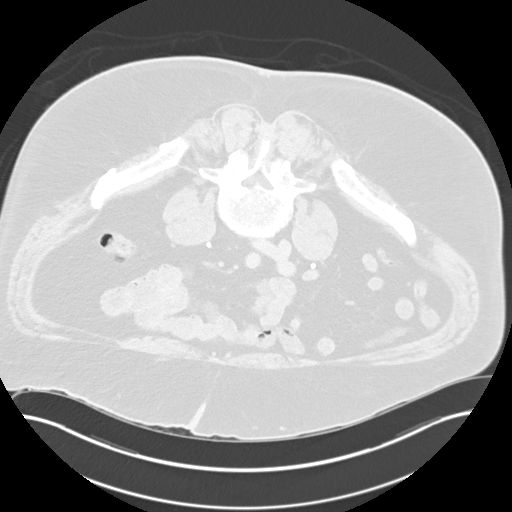
[im 66/116  soft-tissue]
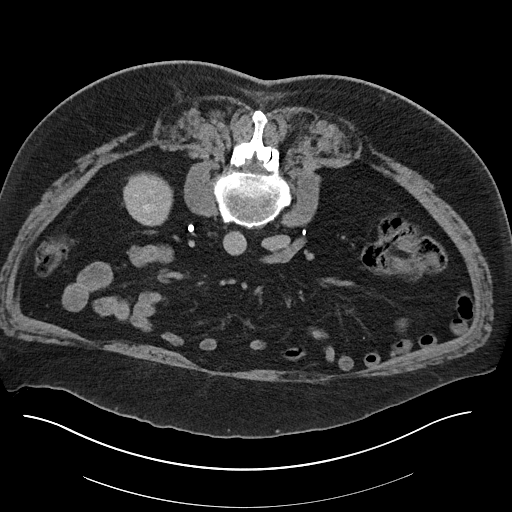
[im 66/116  lung]
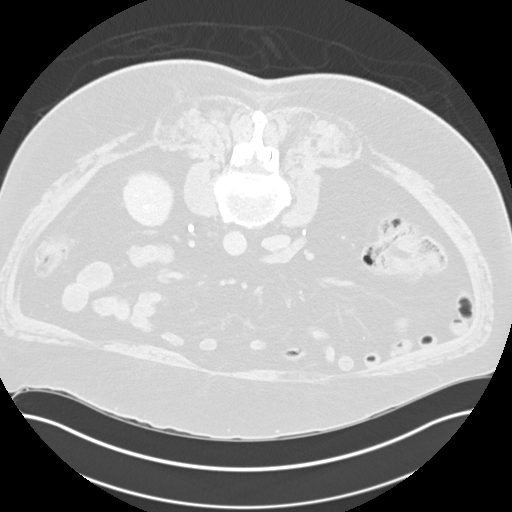
[im 83/116  soft-tissue]
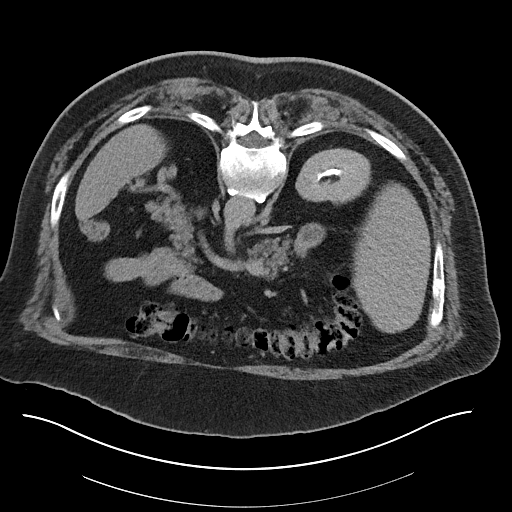
[im 83/116  lung]
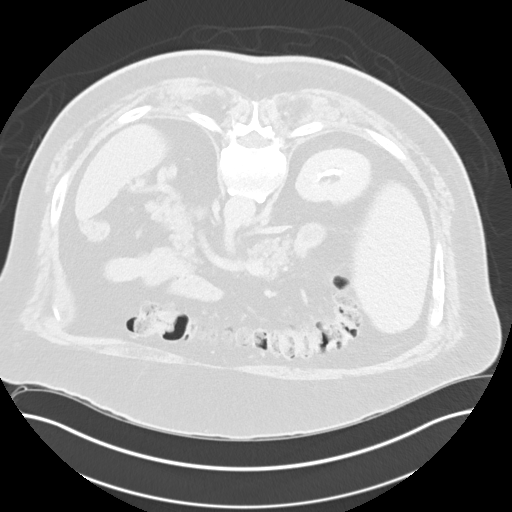
[im 99/116  soft-tissue]
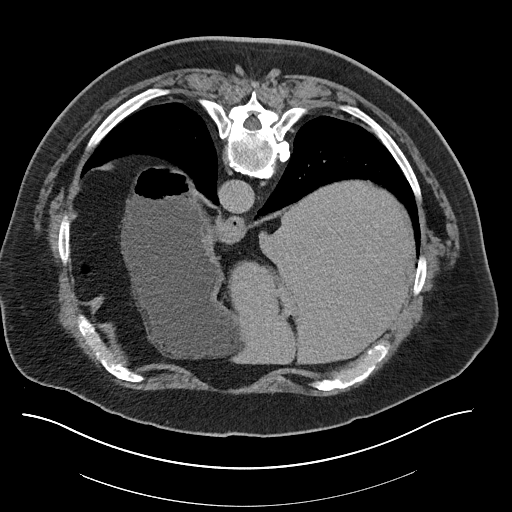
[im 99/116  lung]
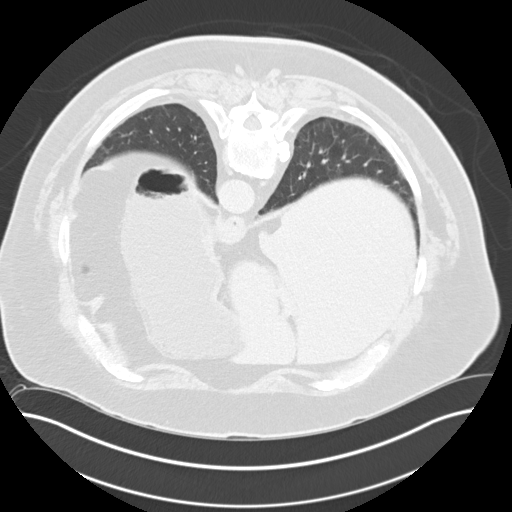

[12 of 46 positions shown; findings below may reference images not displayed]

FINDINGS: Lower chest: The lung bases are clear of an acute process. No
worrisome pulmonary lesions. Age advanced coronary artery
calcifications are noted. The distal esophagus is grossly normal.

Hepatobiliary: No focal hepatic lesions or intrahepatic biliary
dilatation. The gallbladder is normal. No common bile duct
dilatation.

Pancreas: No mass, inflammation or ductal dilatation.

Spleen: Normal size.  No focal lesions.

Adrenals/Urinary Tract: The adrenal glands are normal.

No renal, ureteral or bladder calculi or mass. Both kidneys
demonstrate normal enhancement/perfusion. No worrisome renal
lesions. The delayed images do not demonstrate any significant
collecting system abnormalities. Both ureters are normal. The
bladder is normal.

Stomach/Bowel: The stomach, duodenum, small bowel and colon are
grossly normal. No acute inflammatory changes, mass lesions or
obstructive findings. The terminal ileum and appendix are normal.

Vascular/Lymphatic: Scattered atherosclerotic calcifications
involving the aorta and iliac arteries. No aneurysm or dissection.
The branch vessels are patent. The major venous structures are
patent.

No mesenteric or retroperitoneal mass or adenopathy. Small scattered
lymph nodes are noted.

Reproductive: The prostate gland and seminal vesicles are
unremarkable.

Other: No pelvic mass or adenopathy. No free pelvic fluid
collections. No inguinal mass or adenopathy. No abdominal wall
hernia or subcutaneous lesions.

Musculoskeletal: Chronic mixed lytic and sclerotic process involving
the sacrum, possibly fibrous dysplasia or Paget's disease. No
worrisome bone lesions.
IMPRESSION: 1. No CT findings to explain the patient's microhematuria. No renal,
ureteral or bladder calculi or mass.
2. No acute abdominal/pelvic findings, mass lesions or adenopathy.
3. Age advanced coronary artery calcifications.
4. Chronic changes involving the sacrum possibly fibrous dysplasia
or Paget's disease.

## 2020-06-21 ENCOUNTER — Other Ambulatory Visit
Admission: RE | Admit: 2020-06-21 | Discharge: 2020-06-21 | Disposition: A | Discharge status: Home / Self Care | Payer: Medicare Other | Source: Ambulatory Visit | Attending: Rehabilitative and Restorative Service Providers" | Admitting: Rehabilitative and Restorative Service Providers"

## 2020-06-21 DIAGNOSIS — R0602 Shortness of breath: Secondary | ICD-10-CM | POA: Diagnosis present

## 2020-06-21 DIAGNOSIS — I480 Paroxysmal atrial fibrillation: Secondary | ICD-10-CM | POA: Diagnosis present

## 2020-06-21 LAB — BRAIN NATRIURETIC PEPTIDE: B Natriuretic Peptide: 90.3 pg/mL (ref 0.0–100.0)

## 2024-06-23 ENCOUNTER — Other Ambulatory Visit: Payer: Self-pay | Admitting: Cardiovascular Disease

## 2024-06-23 DIAGNOSIS — I4510 Unspecified right bundle-branch block: Secondary | ICD-10-CM

## 2024-06-23 DIAGNOSIS — I471 Supraventricular tachycardia, unspecified: Secondary | ICD-10-CM

## 2024-06-23 DIAGNOSIS — I480 Paroxysmal atrial fibrillation: Secondary | ICD-10-CM

## 2024-06-23 DIAGNOSIS — R0789 Other chest pain: Secondary | ICD-10-CM

## 2024-06-29 ENCOUNTER — Encounter
Admission: RE | Admit: 2024-06-29 | Discharge: 2024-06-29 | Disposition: A | Discharge status: Home / Self Care | Source: Ambulatory Visit | Attending: Cardiovascular Disease | Admitting: Cardiovascular Disease

## 2024-06-29 DIAGNOSIS — I4510 Unspecified right bundle-branch block: Secondary | ICD-10-CM | POA: Insufficient documentation

## 2024-06-29 DIAGNOSIS — I480 Paroxysmal atrial fibrillation: Secondary | ICD-10-CM | POA: Insufficient documentation

## 2024-06-29 DIAGNOSIS — R0789 Other chest pain: Secondary | ICD-10-CM | POA: Insufficient documentation

## 2024-06-29 DIAGNOSIS — I471 Supraventricular tachycardia, unspecified: Secondary | ICD-10-CM | POA: Diagnosis present

## 2024-06-29 MED ORDER — TECHNETIUM TC 99M TETROFOSMIN IV KIT: 10.0000 | PACK | Freq: Once | INTRAVENOUS | Status: DC | PRN

## 2024-06-29 MED ORDER — TECHNETIUM TC 99M TETROFOSMIN IV KIT
30.0000 | PACK | Freq: Once | INTRAVENOUS | Status: AC | PRN
  Administered 2024-06-29: 30.03 via INTRAVENOUS

## 2024-06-30 ENCOUNTER — Inpatient Hospital Stay
Admission: RE | Admit: 2024-06-30 | Discharge: 2024-06-30 | Attending: Cardiovascular Disease | Admitting: Cardiovascular Disease

## 2024-06-30 LAB — NM MYOCAR MULTI W/SPECT W/WALL MOTION / EF
Estimated workload: 1
Exercise duration (min): 1 min
Exercise duration (sec): 0 s
LV dias vol: 132 mL (ref 62–150)
LV sys vol: 47 mL (ref 4.2–5.8)
MPHR: 146 {beats}/min
Nuc Stress EF: 64 %
Peak HR: 70 {beats}/min
Percent HR: 47 %
Rest HR: 51 {beats}/min
Rest Nuclear Isotope Dose: 32.9 mCi
SDS: 6
SRS: 5
SSS: 12
ST Depression (mm): 0 mm
Stress Nuclear Isotope Dose: 30 mCi
TID: 1.13

## 2024-06-30 MED ORDER — TECHNETIUM TC 99M TETROFOSMIN IV KIT
30.0000 | PACK | Freq: Once | INTRAVENOUS | Status: AC | PRN
  Administered 2024-06-30: 32.46 via INTRAVENOUS

## 2024-06-30 MED ORDER — REGADENOSON 0.4 MG/5ML IV SOLN
0.4000 mg | Freq: Once | INTRAVENOUS | Status: AC
  Administered 2024-06-29: 0.4 mg via INTRAVENOUS
  Filled 2024-06-30: qty 5

## 2024-08-24 ENCOUNTER — Ambulatory Visit
Admission: RE | Admit: 2024-08-24 | Discharge: 2024-08-24 | Disposition: A | Discharge status: Home / Self Care | Attending: Cardiology | Admitting: Cardiology

## 2024-08-24 ENCOUNTER — Encounter: Payer: Self-pay | Admitting: Cardiology

## 2024-08-24 ENCOUNTER — Ambulatory Visit: Admitting: Certified Registered"

## 2024-08-24 ENCOUNTER — Encounter: Admission: RE | Disposition: A | Payer: Self-pay | Source: Home / Self Care | Attending: Cardiology

## 2024-08-24 DIAGNOSIS — E785 Hyperlipidemia, unspecified: Secondary | ICD-10-CM | POA: Insufficient documentation

## 2024-08-24 DIAGNOSIS — R9439 Abnormal result of other cardiovascular function study: Secondary | ICD-10-CM

## 2024-08-24 DIAGNOSIS — I493 Ventricular premature depolarization: Secondary | ICD-10-CM | POA: Diagnosis not present

## 2024-08-24 DIAGNOSIS — I4510 Unspecified right bundle-branch block: Secondary | ICD-10-CM | POA: Diagnosis not present

## 2024-08-24 DIAGNOSIS — I480 Paroxysmal atrial fibrillation: Secondary | ICD-10-CM | POA: Diagnosis not present

## 2024-08-24 DIAGNOSIS — Z7982 Long term (current) use of aspirin: Secondary | ICD-10-CM | POA: Insufficient documentation

## 2024-08-24 DIAGNOSIS — J45909 Unspecified asthma, uncomplicated: Secondary | ICD-10-CM | POA: Diagnosis not present

## 2024-08-24 DIAGNOSIS — I10 Essential (primary) hypertension: Secondary | ICD-10-CM | POA: Insufficient documentation

## 2024-08-24 DIAGNOSIS — Z79899 Other long term (current) drug therapy: Secondary | ICD-10-CM | POA: Diagnosis not present

## 2024-08-24 DIAGNOSIS — I2510 Atherosclerotic heart disease of native coronary artery without angina pectoris: Secondary | ICD-10-CM | POA: Insufficient documentation

## 2024-08-24 HISTORY — PX: LEFT HEART CATH AND CORONARY ANGIOGRAPHY: CATH118249

## 2024-08-24 SURGERY — LEFT HEART CATH AND CORONARY ANGIOGRAPHY
Anesthesia: Moderate Sedation | Laterality: Left

## 2024-08-24 MED ORDER — LABETALOL HCL 5 MG/ML IV SOLN: 10.0000 mg | INTRAVENOUS | Status: DC | PRN

## 2024-08-24 MED ORDER — ONDANSETRON HCL 4 MG/2ML IJ SOLN: 4.0000 mg | Freq: Four times a day (QID) | INTRAMUSCULAR | Status: DC | PRN

## 2024-08-24 MED ORDER — SODIUM CHLORIDE 0.9 % IV SOLN
250.0000 mL | INTRAVENOUS | Status: DC | PRN
  Administered 2024-08-24: 07:00:00 250 mL via INTRAVENOUS

## 2024-08-24 MED ORDER — FENTANYL CITRATE (PF) 100 MCG/2ML IJ SOLN
INTRAMUSCULAR | Status: DC | PRN
  Administered 2024-08-24: 08:00:00 25 ug via INTRAVENOUS

## 2024-08-24 MED ORDER — VERAPAMIL HCL 2.5 MG/ML IV SOLN
INTRAVENOUS | Status: DC | PRN
  Administered 2024-08-24: 08:00:00 2.5 mg via INTRA_ARTERIAL

## 2024-08-24 MED ORDER — IOHEXOL 300 MG/ML  SOLN
INTRAMUSCULAR | Status: DC | PRN
  Administered 2024-08-24: 08:00:00 76 mL

## 2024-08-24 MED ORDER — FENTANYL CITRATE (PF) 100 MCG/2ML IJ SOLN
INTRAMUSCULAR | Status: AC
  Filled 2024-08-24: qty 2

## 2024-08-24 MED ORDER — MIDAZOLAM HCL (PF) 2 MG/2ML IJ SOLN
INTRAMUSCULAR | Status: DC | PRN
  Administered 2024-08-24: 08:00:00 1 mg via INTRAVENOUS

## 2024-08-24 MED ORDER — SODIUM CHLORIDE 0.9% FLUSH: 3.0000 mL | Freq: Two times a day (BID) | INTRAVENOUS | Status: DC

## 2024-08-24 MED ORDER — HEPARIN SODIUM (PORCINE) 1000 UNIT/ML IJ SOLN
INTRAMUSCULAR | Status: DC | PRN
  Administered 2024-08-24: 08:00:00 5000 [IU] via INTRAVENOUS

## 2024-08-24 MED ORDER — HEPARIN (PORCINE) IN NACL 1000-0.9 UT/500ML-% IV SOLN
INTRAVENOUS | Status: DC | PRN
  Administered 2024-08-24 (×2): 500 mL

## 2024-08-24 MED ORDER — HEPARIN SODIUM (PORCINE) 1000 UNIT/ML IJ SOLN
INTRAMUSCULAR | Status: AC
  Filled 2024-08-24: qty 10

## 2024-08-24 MED ORDER — LIDOCAINE HCL 1 % IJ SOLN
INTRAMUSCULAR | Status: AC
  Filled 2024-08-24: qty 20

## 2024-08-24 MED ORDER — ASPIRIN 81 MG PO CHEW: 81.0000 mg | CHEWABLE_TABLET | ORAL | Status: AC

## 2024-08-24 MED ORDER — HEPARIN (PORCINE) IN NACL 1000-0.9 UT/500ML-% IV SOLN
INTRAVENOUS | Status: AC
  Filled 2024-08-24: qty 1000

## 2024-08-24 MED ORDER — HYDRALAZINE HCL 20 MG/ML IJ SOLN: 10.0000 mg | INTRAMUSCULAR | Status: DC | PRN

## 2024-08-24 MED ORDER — FREE WATER
500.0000 mL | Freq: Once | Status: AC
  Administered 2024-08-24: 07:00:00 500 mL via ORAL

## 2024-08-24 MED ORDER — SODIUM CHLORIDE 0.9% FLUSH: 3.0000 mL | INTRAVENOUS | Status: DC | PRN

## 2024-08-24 MED ORDER — ACETAMINOPHEN 325 MG PO TABS: 650.0000 mg | ORAL_TABLET | ORAL | Status: DC | PRN

## 2024-08-24 MED ORDER — MIDAZOLAM HCL 2 MG/2ML IJ SOLN
INTRAMUSCULAR | Status: AC
  Filled 2024-08-24: qty 2

## 2024-08-24 MED ORDER — LIDOCAINE HCL (PF) 1 % IJ SOLN
INTRAMUSCULAR | Status: DC | PRN
  Administered 2024-08-24: 08:00:00 2 mL

## 2024-08-24 MED ORDER — ASPIRIN 81 MG PO CHEW
CHEWABLE_TABLET | ORAL | Status: AC
  Administered 2024-08-24: 07:00:00 81 mg via ORAL
  Filled 2024-08-24: qty 1

## 2024-08-24 MED ORDER — VERAPAMIL HCL 2.5 MG/ML IV SOLN
INTRAVENOUS | Status: AC
  Filled 2024-08-24: qty 2

## 2024-08-24 MED ORDER — SODIUM CHLORIDE 0.9 % IV SOLN: 250.0000 mL | INTRAVENOUS | Status: DC | PRN

## 2024-08-24 SURGICAL SUPPLY — 9 items
CATH INFINITI 5FR MULTPACK ANG (CATHETERS) IMPLANT
DEVICE RAD COMP TR BAND LRG (VASCULAR PRODUCTS) IMPLANT
DRAPE BRACHIAL (DRAPES) IMPLANT
GLIDESHEATH SLEND SS 6F .021 (SHEATH) IMPLANT
GUIDEWIRE INQWIRE 1.5J.035X260 (WIRE) IMPLANT
PACK CARDIAC CATH (CUSTOM PROCEDURE TRAY) ×1 IMPLANT
PANNUS RETENTION SYSTEM 2 PAD (MISCELLANEOUS) IMPLANT
SET ATX-X65L (MISCELLANEOUS) IMPLANT
STATION PROTECTION PRESSURIZED (MISCELLANEOUS) IMPLANT

## 2024-08-24 NOTE — Anesthesia Procedure Notes (Signed)
 Procedure Name: MAC Date/Time: 08/24/2024 7:30 AM  Performed by: Lacretia Camelia NOVAK, CRNAPre-anesthesia Checklist: Patient identified, Emergency Drugs available, Suction available and Patient being monitored Patient Re-evaluated:Patient Re-evaluated prior to induction Oxygen Delivery Method: Nasal cannula
